# Patient Record
Sex: Male | Born: 1990 | Race: White | Hispanic: No | Marital: Single
Health system: Southern US, Community
[De-identification: ages and names within clinical notes are randomized; demographics above are authoritative.]

---

## 2012-06-10 ENCOUNTER — Inpatient Hospital Stay: Payer: Self-pay | Admitting: Cardiothoracic Surgery

## 2012-06-10 LAB — COMPREHENSIVE METABOLIC PANEL
Albumin: 4.6 g/dL (ref 3.4–5.0)
Alkaline Phosphatase: 72 U/L (ref 50–136)
Anion Gap: 9 (ref 7–16)
BUN: 15 mg/dL (ref 7–18)
Chloride: 103 mmol/L (ref 98–107)
EGFR (African American): >60
EGFR (Non-African Amer.): >60
Osmolality: 278 (ref 275–301)
Potassium: 4.1 mmol/L (ref 3.5–5.1)
SGOT(AST): 78 U/L — ABNORMAL HIGH (ref 15–37)
Sodium: 139 mmol/L (ref 136–145)
Total Protein: 8.2 g/dL (ref 6.4–8.2)

## 2012-06-10 LAB — CBC
HCT: 44.3 % (ref 40.0–52.0)
MCHC: 35 g/dL (ref 32.0–36.0)
MCV: 89 fL (ref 80–100)
Platelet: 178 10*3/uL (ref 150–440)
RBC: 4.98 10*6/uL (ref 4.40–5.90)
RDW: 13.2 % (ref 11.5–14.5)

## 2012-06-20 ENCOUNTER — Ambulatory Visit: Payer: Self-pay | Admitting: Cardiothoracic Surgery

## 2012-06-25 ENCOUNTER — Ambulatory Visit: Payer: Self-pay | Admitting: Cardiothoracic Surgery

## 2012-07-26 ENCOUNTER — Ambulatory Visit: Payer: Self-pay | Admitting: Cardiothoracic Surgery

## 2014-02-24 IMAGING — CR DG CHEST 2V
1 series · 2 of 2 positions shown · non-contrast
Comparison: none

REASON FOR EXAM: chest pain
COMMENTS:   May transport without cardiac monitor

[Series 1: w chest pa · 0.14mm/px · 2 of 2 slices shown]
[im 1/2]
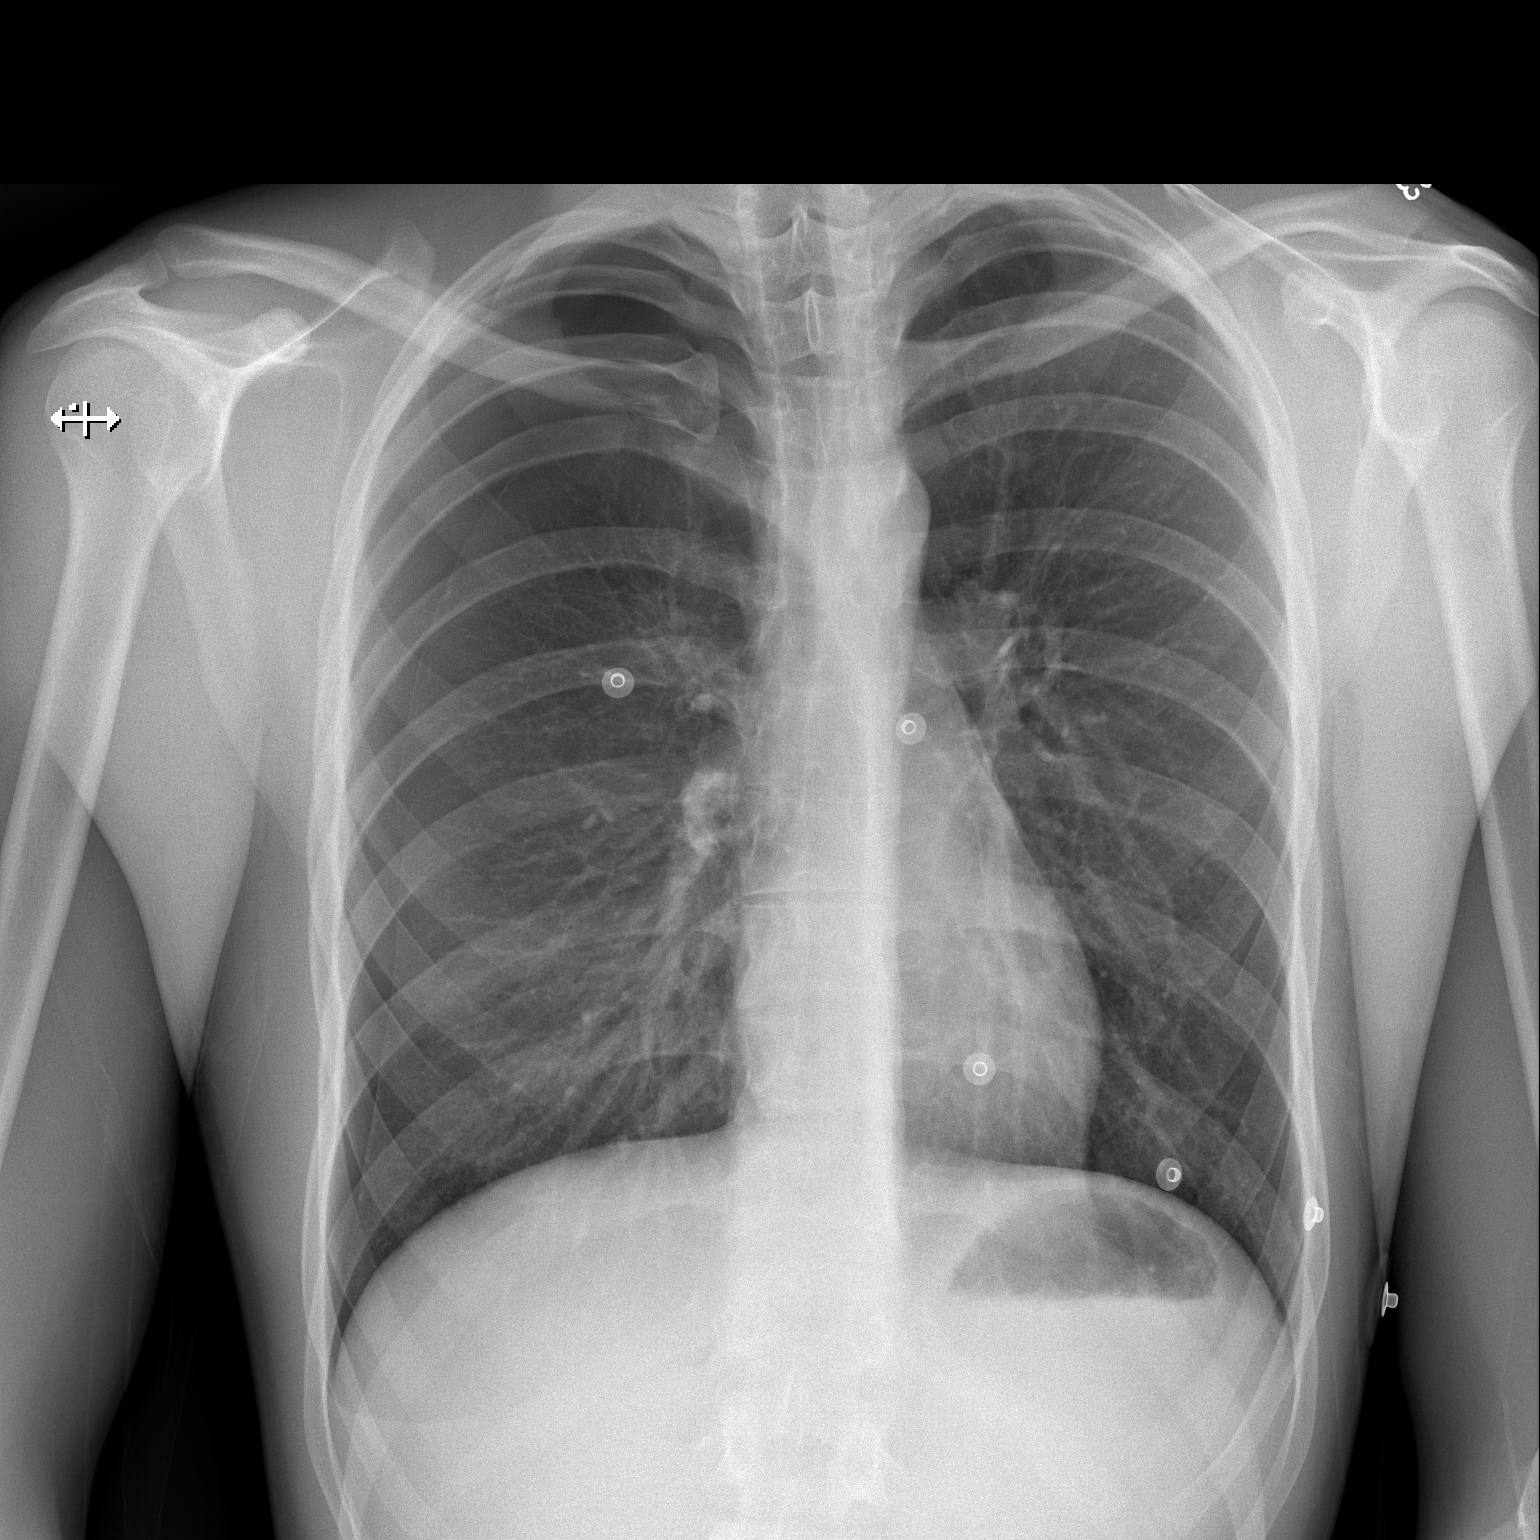
[im 2/2]
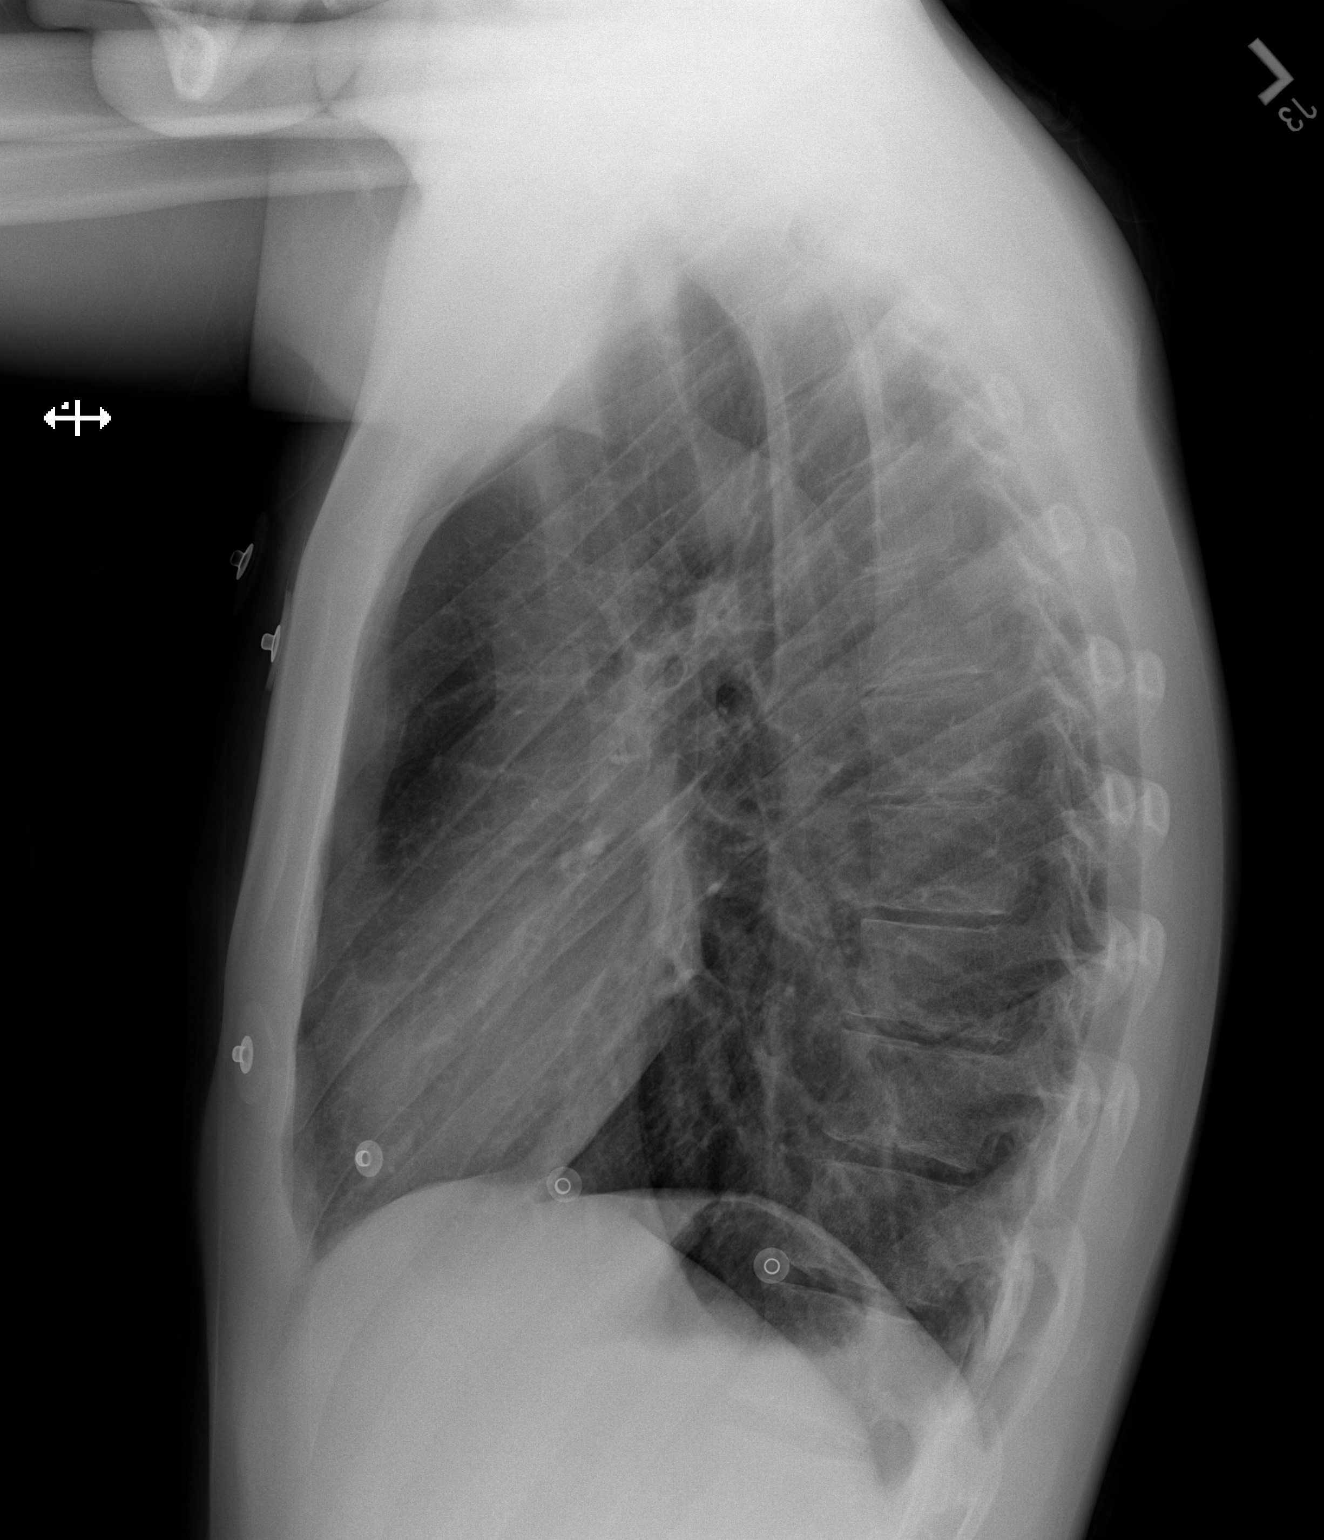

[2 of 2 positions shown; findings below may reference images not displayed]

PROCEDURE:     DXR - DXR CHEST PA (OR AP) AND LATERAL  - June 10, 2012 [DATE]

RESULT:     There is no previous exam for comparison. There is a
moderate-sized right-sided pneumothorax. The apex of the inflated lung
projects over the posterior right fifth rib. Pneumothorax extends to the
right costophrenic angle. The left lung is fully inflated and clear. There
is no mediastinal shift. The bony structures appear to be intact. No foreign
body is evident.
IMPRESSION: Right-sided pneumothorax. This was noted by the emergency
department on the preliminary report.

[REDACTED]

## 2014-02-25 IMAGING — CR DG CHEST 2V
1 series · 2 of 2 positions shown · non-contrast
Comparison: none

REASON FOR EXAM: assess for pleural effusion
COMMENTS:

PROCEDURE:     DXR - DXR CHEST PA (OR AP) AND LATERAL  - June 11, 2012  [DATE]
RESULT:     Comparison: 06/11/2012

[Series 1: pa · 0.17mm/px · 2 of 2 slices shown]
[im 1/2]
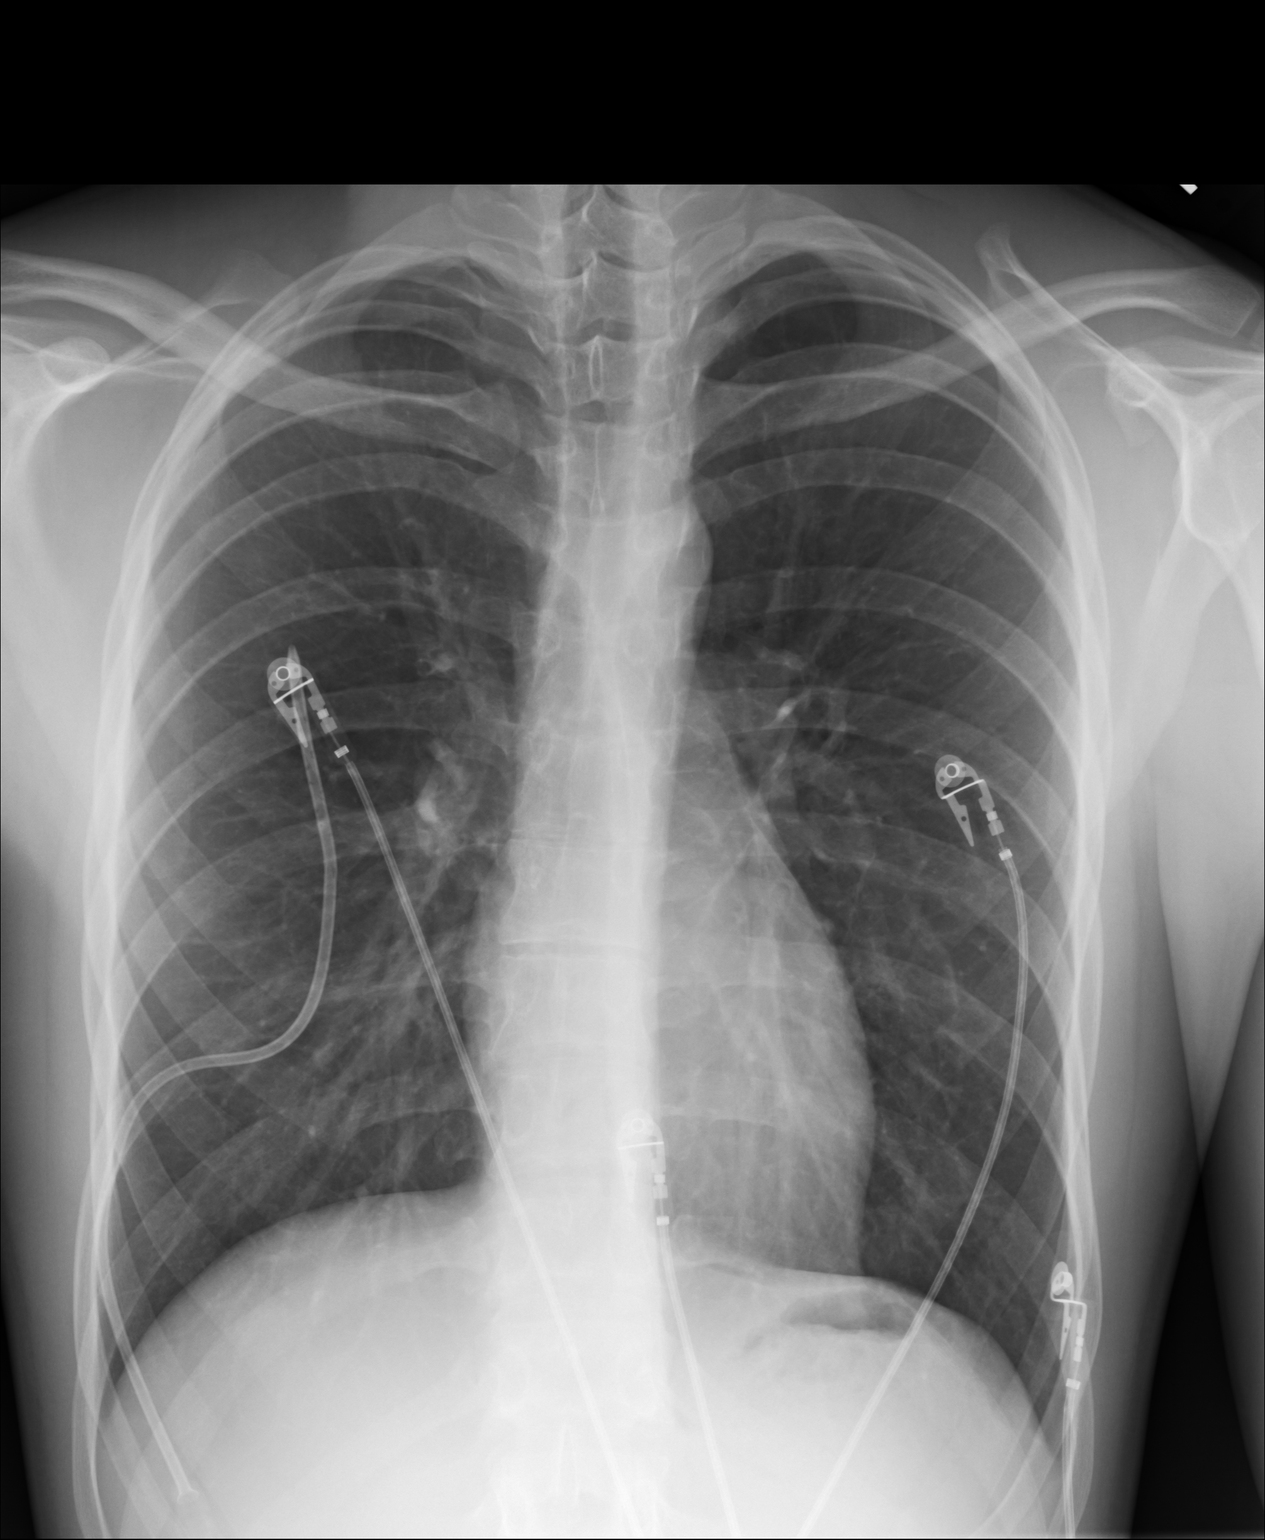
[im 2/2]
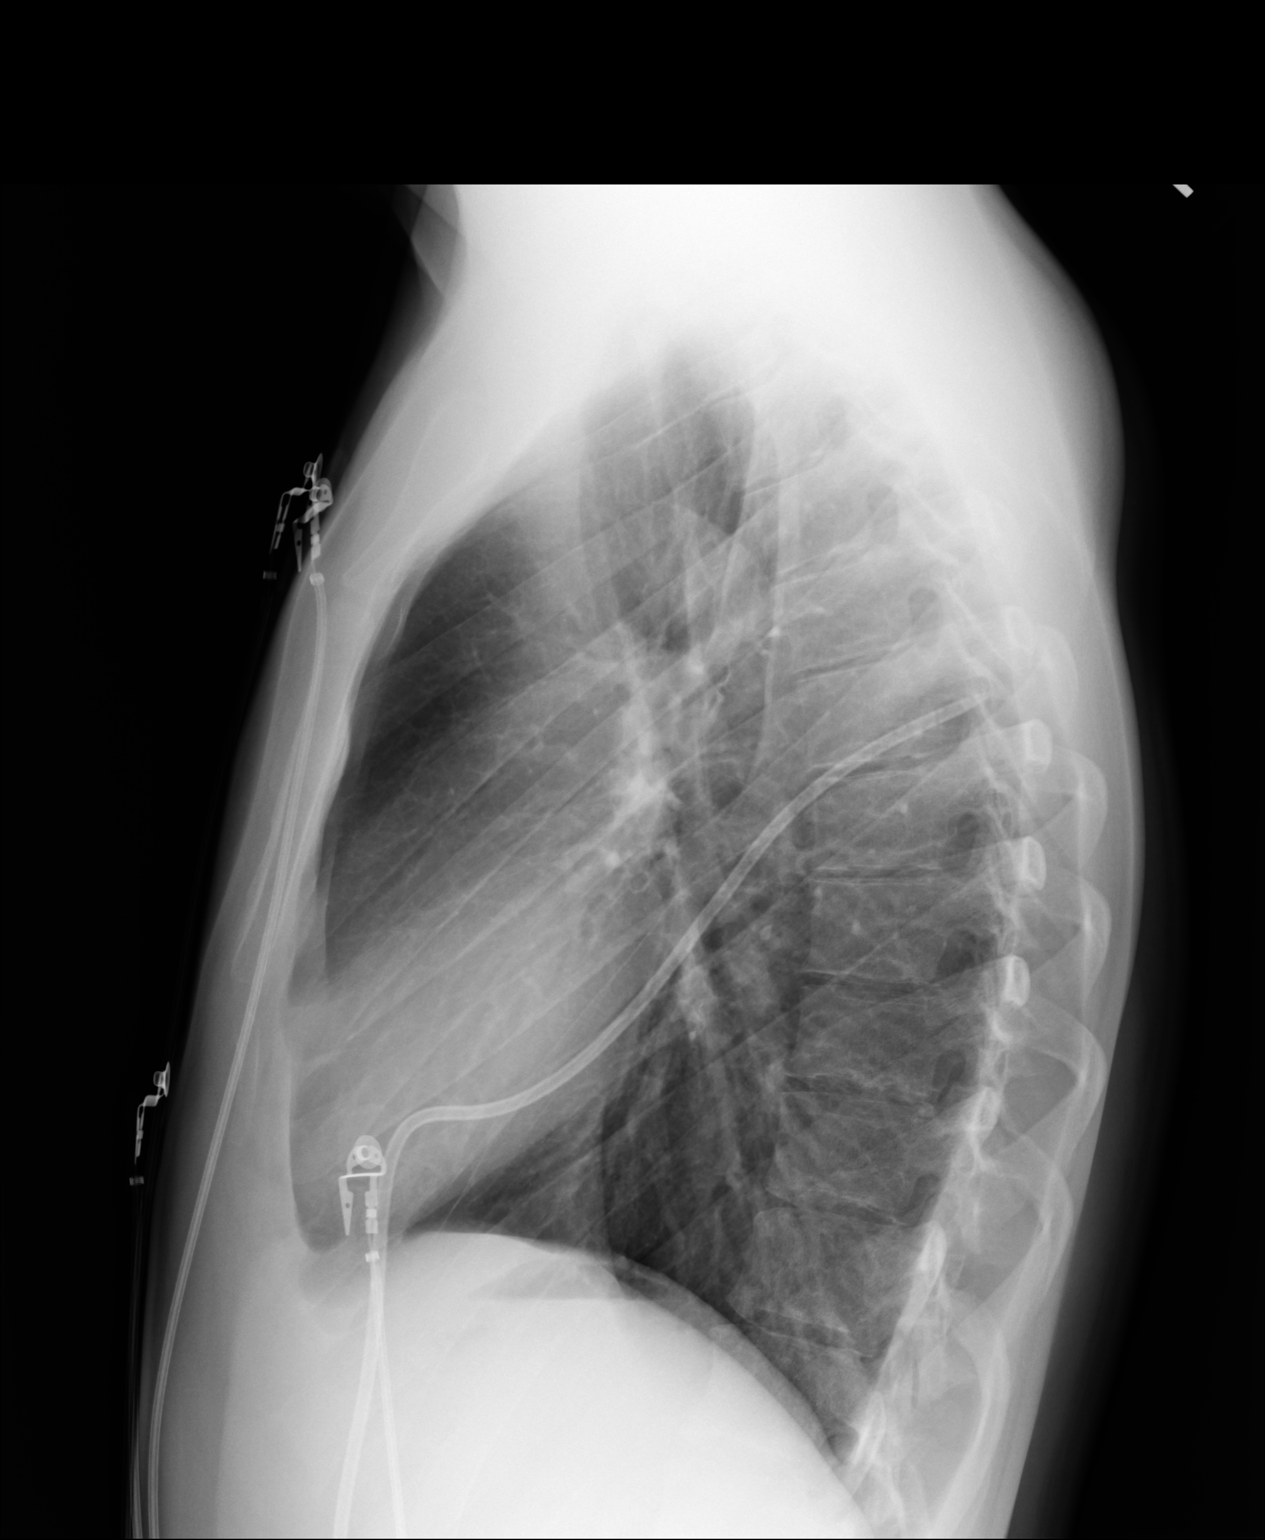

[2 of 2 positions shown; findings below may reference images not displayed]

FINDINGS: The heart and mediastinum are stable. The lungs are hyperinflated. A
right-sided chest tube is in place. There is a trace residual right apical
pneumothorax. No focal pulmonary opacities.
IMPRESSION: Trace residual right apical pneumothorax.

[REDACTED]

## 2014-02-25 IMAGING — CR DG CHEST 1V PORT
1 series · 1 of 1 positions shown · non-contrast
Comparison: none

REASON FOR EXAM: Assess for Pleural Effusion
COMMENTS:

PROCEDURE:     DXR - DXR PORTABLE CHEST SINGLE VIEW  - June 11, 2012  [DATE]
RESULT:     Comparison: 06/10/2012

[ap]
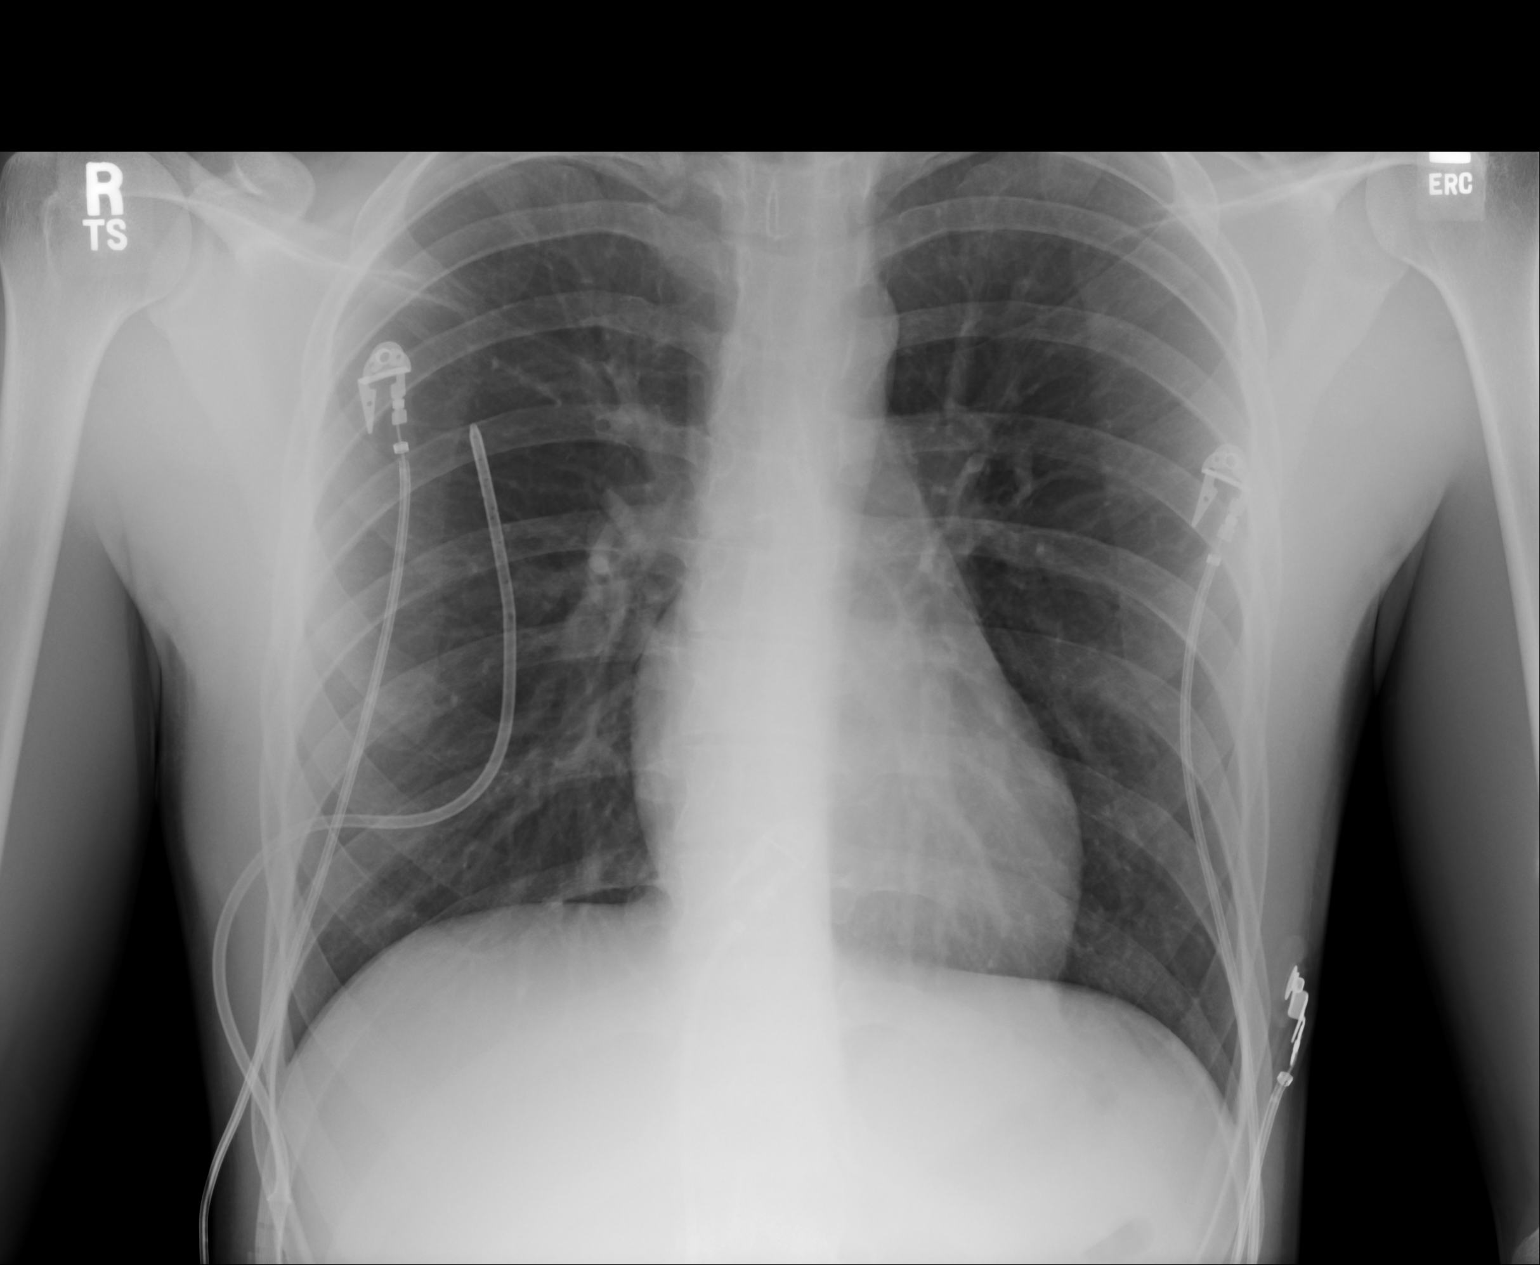

[1 of 1 positions shown; findings below may reference images not displayed]

FINDINGS: The heart and mediastinum are stable. The right chest tube remains. No
pneumothorax seen. No focal pulmonary opacities.
IMPRESSION: No pneumothorax seen, with right-sided chest tube in place.

[REDACTED]

## 2014-02-26 IMAGING — CR DG CHEST 2V
1 series · 2 of 2 positions shown · non-contrast
Comparison: none

REASON FOR EXAM: assess for pleural effusion after chest tube removal
COMMENTS:

PROCEDURE:     DXR - DXR CHEST PA (OR AP) AND LATERAL  - June 12, 2012  [DATE]
RESULT:     Comparison: 06/12/2012

[Series 6: w chest lat · 0.14mm/px · 2 of 2 slices shown]
[im 1/2]
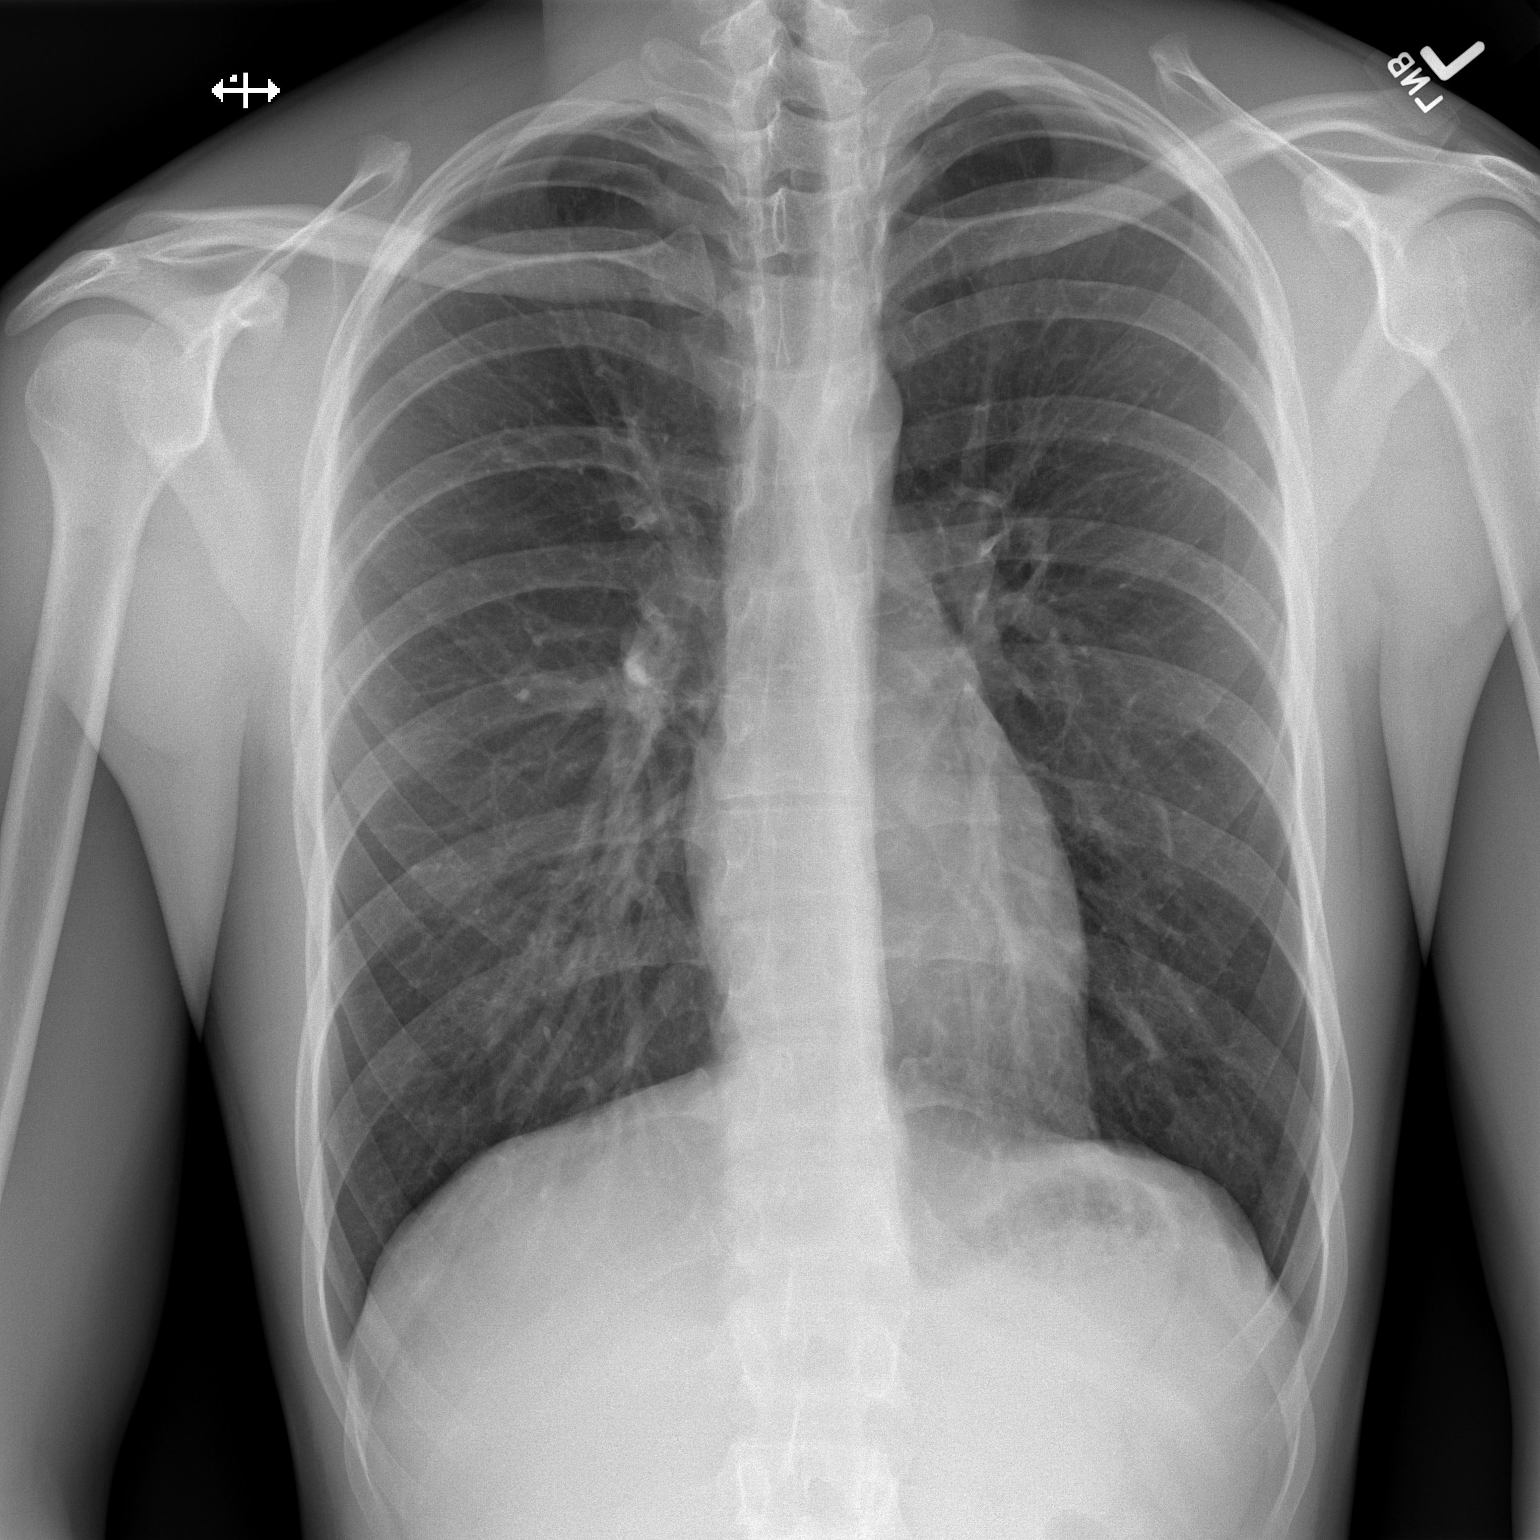
[im 2/2]
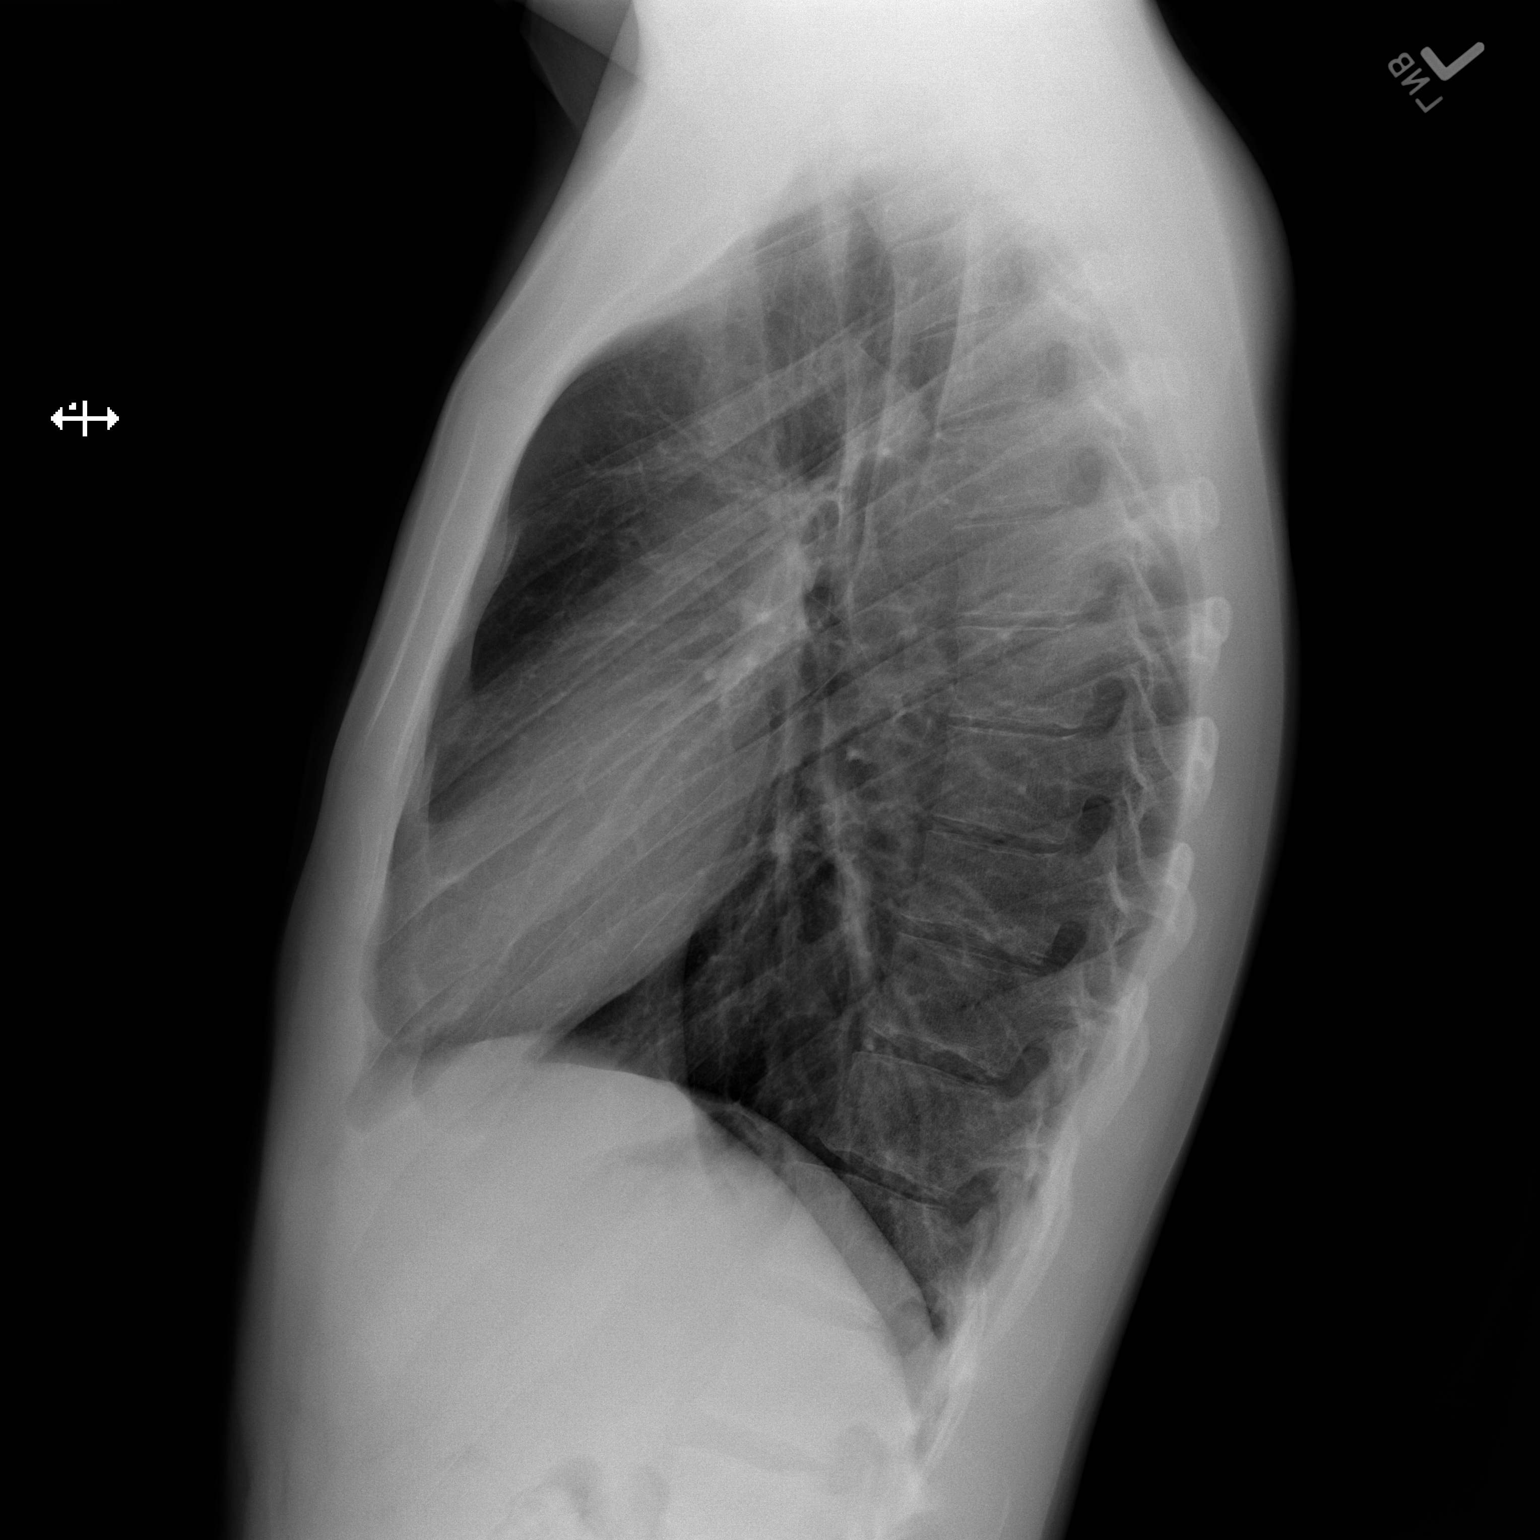

[2 of 2 positions shown; findings below may reference images not displayed]

FINDINGS: The right chest tube has been removed. There may be a trace right apical
pneumothorax, similar to prior studies. No focal pulmonary opacities. The
heart and mediastinum are stable. The lungs are hyperinflated.
IMPRESSION: Possible, trace right apical pneumothorax, similar to prior studies.

[REDACTED]

## 2014-02-26 IMAGING — CR DG CHEST 2V
1 series · 2 of 2 positions shown · non-contrast
Comparison: none

REASON FOR EXAM: assess for pleural effusion
COMMENTS:

[Series 3: w chest pa · 0.14mm/px · 2 of 2 slices shown]
[im 1/2]
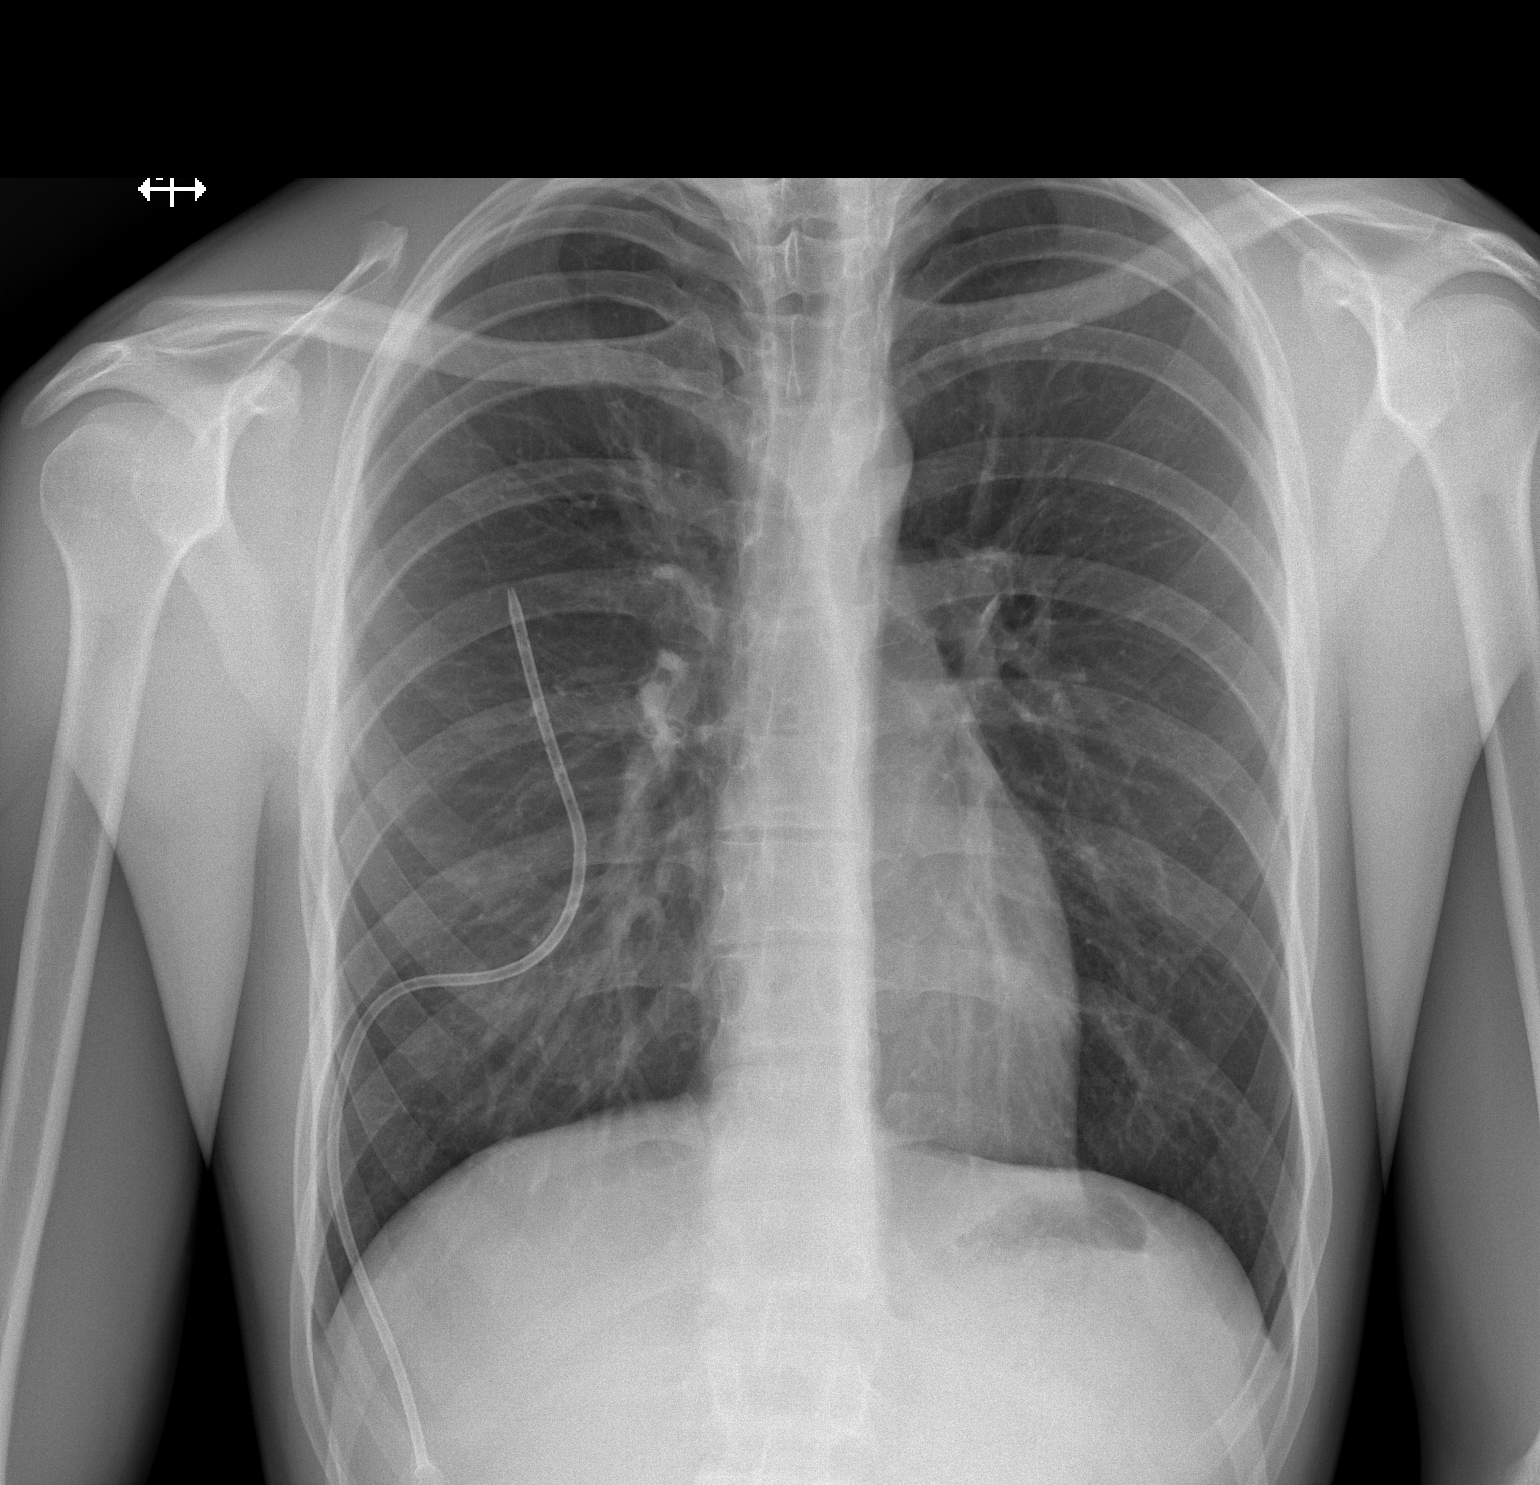
[im 2/2]
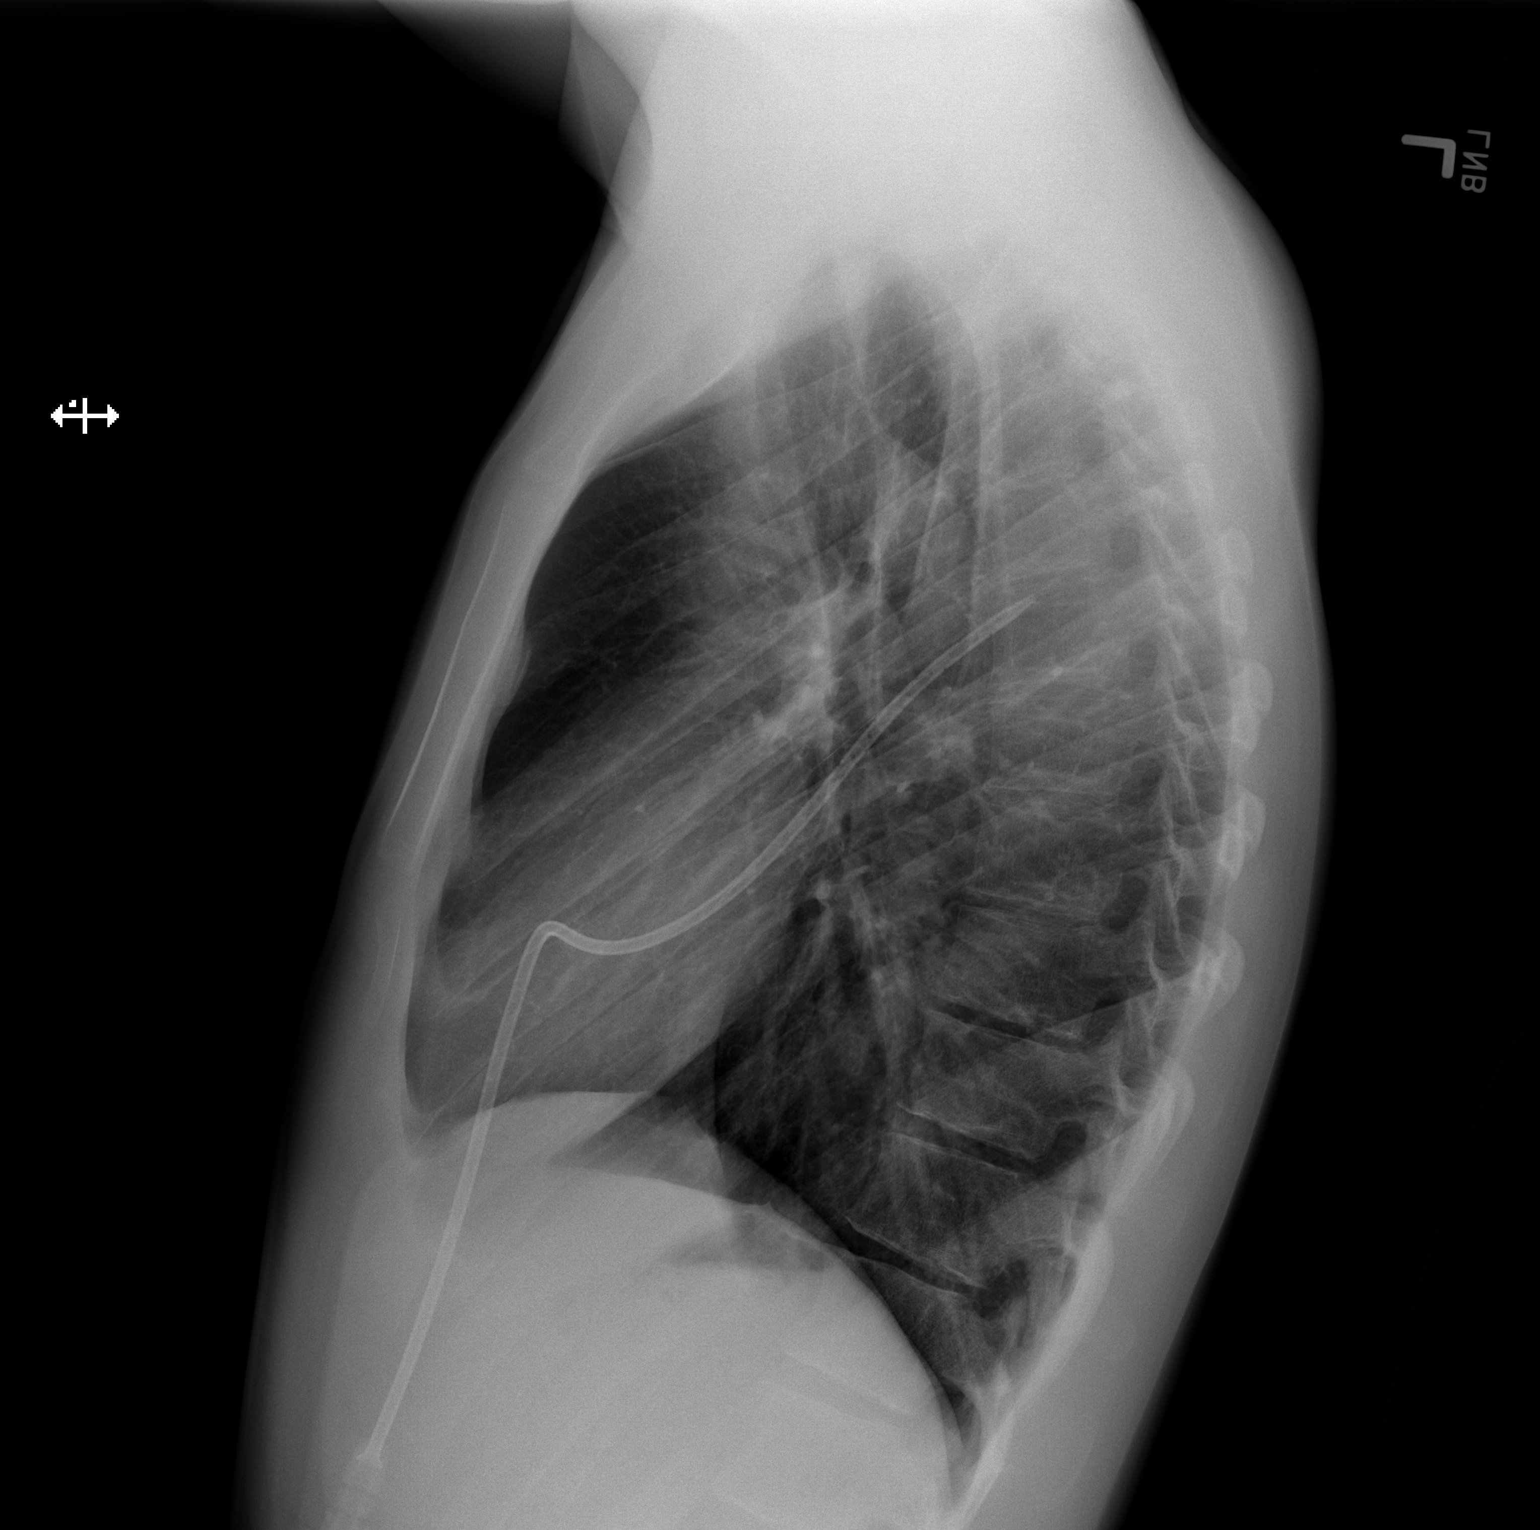

[2 of 2 positions shown; findings below may reference images not displayed]

PROCEDURE:     DXR - DXR CHEST PA (OR AP) AND LATERAL  - June 12, 2012  [DATE]

RESULT:     Comparison is made to the previous study 12 June, 2012.

Small caliber right chest tube remains in place. No pneumothorax is
visualized. The lungs appear fully inflated and clear. The cardiac
silhouette is normal. The bony structures are within normal limits.
IMPRESSION: 1. No evidence of residual pneumothorax. Right chest tube present.

[REDACTED]

## 2015-01-12 NOTE — H&P (Signed)
PATIENT NAME:  Frederick Clark, Frederick Clark MR#:  [id number] DATE OF BIRTH:  05-Aug-1991  DATE OF ADMISSION:  06/10/2012  ADMITTING DIAGNOSIS: Spontaneous pneumothorax, right side.   HISTORY OF PRESENT ILLNESS: Mr. Frederick Clark is a 24 year old white male who was walking from school this morning to the parking lot when he experienced acute onset of shortness of breath and pain in his right chest. He was accompanied by one of his friends who after a short period of time made the decision to call 911 and the patient was brought to the Emergency Department. While in the Emergency Department, a chest x-ray was made and revealed a moderate-sized right pneumothorax. We were consulted for chest tube placement which was performed without difficulty and will be dictated under separate cover. Once the chest tube was inserted, the chest x-ray showed prompt expansion of the lung without any air leak.   MEDICATIONS: None.  DRUG ALLERGIES: No known drug allergies.   PAST MEDICAL HISTORY: Tonsillectomy.   SOCIAL HISTORY: He is a [employer] student. He is not married. He does not drink or smoke.   FAMILY HISTORY: There is perhaps a family history of spontaneous pneumothorax in an uncle. Both mother and father are alive and well.   REVIEW OF SYSTEMS: A complete review of systems was asked and was negative except for those in the history of present illness.   PHYSICAL EXAMINATION:   GENERAL: Pleasant, well-developed, well-nourished gentleman in no acute distress.   HEENT: Head is normocephalic and atraumatic. The pupils were equal.   NECK: Supple without thyromegaly or adenopathy. There were no palpable masses.   LUNGS: His lungs showed diminished breath sounds on the right.   HEART: Regular.   ABDOMEN: Soft, nontender, and nondistended. There were no palpable masses. There was no hepatosplenomegaly.   EXTREMITIES: No clubbing, cyanosis, or edema.   ASSESSMENT AND PLAN: Spontaneous pneumothorax. We will go ahead and admit  the patient following chest tube insertion to monitor the patient's chest x-ray daily. I did discuss his care with his mother and father who were in the Emergency Room at the time. They understand the plan at this point in time is conservative management.  ____________________________ Sheppard Plumberimothy E. Thelma Bargeaks, MD teo:slb D: 06/10/2012 15:16:07 ET T: 06/10/2012 15:37:47 ET JOB#: [id number]  cc: Marcial Pacasimothy E. Thelma Bargeaks, MD, <Dictator> Jasmine DecemberIMOTHY E Karliah Kowalchuk MD ELECTRONICALLY SIGNED 06/19/2012 10:17

## 2015-01-12 NOTE — Discharge Summary (Signed)
PATIENT NAME:  Frederick BologneseBUCK, Frederick C MR#:  [id number] DATE OF BIRTH:  09-16-1991  DATE OF ADMISSION:  06/10/2012 DATE OF DISCHARGE:  06/12/2012  ADMITTING DIAGNOSIS: Right spontaneous pneumothorax.   DISCHARGE DIAGNOSIS: Right spontaneous pneumothorax.   OPERATION PERFORMED: Insertion of right-sided chest tube.   HOSPITAL COURSE: [name] is a 24 year old gentleman who experienced acute onset of shortness of breath while walking across the parking [facility] [employer]. He felt some pain in his right chest and came to the Emergency Department where a chest x-ray showed a large right-sided pneumothorax. This was managed with a chest tube. He was admitted to the hospital and over the next several days his air leak stopped and the chest tube was ultimately removed. After removal of the chest tube a chest x-ray was obtained. This revealed no change in the possible trace right apical pneumothorax. The patient was discharged to home. He was instructed to leave his dressings in place for 48 hours. He will follow up with me in one week.   DISCHARGE MEDICATIONS:  Percocet 1 to 2 tabs as needed for pain every six hours. A prescription was written for him.   ____________________________ Sheppard Plumberimothy E. Thelma Bargeaks, MD teo:bjt D: 06/12/2012 16:15:24 ET T: 06/14/2012 11:00:16 ET JOB#: [id number]  cc: Marcial Pacasimothy E. Thelma Bargeaks, MD, <Dictator> Jasmine DecemberIMOTHY E Evie Crumpler MD ELECTRONICALLY SIGNED 06/19/2012 10:17
